# Patient Record
Sex: Male | Born: 1985 | Hispanic: Yes | Marital: Single | Smoking: Never smoker
Health system: Southern US, Community
[De-identification: ages and names within clinical notes are randomized; demographics above are authoritative.]

---

## 2018-09-26 ENCOUNTER — Ambulatory Visit: Payer: Self-pay | Admitting: Urology

## 2018-09-27 ENCOUNTER — Ambulatory Visit: Payer: Self-pay | Admitting: Urology

## 2019-02-10 ENCOUNTER — Other Ambulatory Visit: Payer: Self-pay

## 2019-02-10 ENCOUNTER — Encounter: Payer: Self-pay | Admitting: Emergency Medicine

## 2019-02-10 ENCOUNTER — Emergency Department
Admission: EM | Admit: 2019-02-10 | Discharge: 2019-02-10 | Disposition: A | Discharge status: Home / Self Care | Payer: Self-pay | Attending: Emergency Medicine | Admitting: Emergency Medicine

## 2019-02-10 DIAGNOSIS — K122 Cellulitis and abscess of mouth: Secondary | ICD-10-CM | POA: Insufficient documentation

## 2019-02-10 NOTE — ED Notes (Signed)
Pt stated he felt like he was going to throw up and I provided and emesis bag. Pt stated that was because "he drank a lot last night and was hung over"

## 2019-02-10 NOTE — ED Notes (Signed)
Pt states he was in shower this morning and tried to clear his throat and a "bubble came up in my throat and I couldn't talk or swallow" pt denies LOC. Pt states he can still feel the bubble in his throat. This RN looked in pt's throat with light and did not observe anything.

## 2019-02-10 NOTE — ED Provider Notes (Signed)
[facility] Emergency Department Provider Note  ____________________________________________   First MD Initiated Contact with Patient 02/10/19 1328     (approximate)  I have reviewed the triage vital signs and the nursing notes.   HISTORY  Chief Complaint Sore Throat   HPI Derrick Coleman is a 34 y.o. male presents to the ED with complaint of throat problems.  Patient states that he drank "a lot" of beer last night.  He states that this morning he was in the shower and try to clear his throat when a "bubble" came up in his throat.  He states that at that time he could not talk or swallow.  He also states that he was hung over.  He states that his girlfriend also "saw the bubble".  At that time patient was unable to speak in came straight to the emergency department.  While sitting in the lobby he was able to swallow his own secretions and also began talking.  He states that this is never happened to him before.  Prior to arrival he had not had any fluid to drink or food to eat.  Currently he denies any difficulty breathing or swallowing.  He denies any vomiting or reflux.      History reviewed. No pertinent past medical history.  There are no problems to display for this patient.   History reviewed. No pertinent surgical history.  Prior to Admission medications   Not on File    Allergies Patient has no known allergies.  History reviewed. No pertinent family history.  Social History Social History   Tobacco Use  . Smoking status: Never Smoker  . Smokeless tobacco: Never Used  Substance Use Topics  . Alcohol use: Yes  . Drug use: Never    Review of Systems Constitutional: No fever/chills Eyes: No visual changes. ENT: Positive questionable sore throat. Cardiovascular: Denies chest pain. Respiratory: Denies shortness of breath. Gastrointestinal:No nausea, no vomiting.  Genitourinary: Negative for dysuria. Musculoskeletal: Negative for  back pain. Skin: Negative for rash. Neurological: Negative for headaches, focal weakness or numbness. ____________________________________________   PHYSICAL EXAM:  VITAL SIGNS: ED Triage Vitals  Enc Vitals Group     BP 02/10/19 1220 122/84     Pulse Rate 02/10/19 1220 (!) 108     Resp 02/10/19 1220 18     Temp 02/10/19 1220 99.2 F (37.3 C)     Temp Source 02/10/19 1220 Oral     SpO2 02/10/19 1220 96 %     Weight 02/10/19 1220 150 lb (68 kg)     Height 02/10/19 1220 5\' 8"  (1.727 m)     Head Circumference --      Peak Flow --      Pain Score 02/10/19 1230 0     Pain Loc --      Pain Edu? --      Excl. in [place]? --    Constitutional: Alert and oriented. Well appearing and in no acute distress.  Patient is speaking without any difficulty and maintaining secretions as well. Eyes: Conjunctivae are normal.  Head: Atraumatic. Nose: No congestion/rhinnorhea. Mouth/Throat: Mucous membranes are moist.  Oropharynx non-erythematous.  Uvula is slightly edematous without erythema or exudate.  Patient is able to speak without any difficulty and maintain saliva without difficulty. Neck: No stridor.   Hematological/Lymphatic/Immunilogical: No cervical lymphadenopathy. Cardiovascular: Normal rate, regular rhythm. Grossly normal heart sounds.  Good peripheral circulation. Respiratory: Normal respiratory effort.  No retractions. Lungs CTAB. Musculoskeletal: Moves upper and  lower extremities with any difficulty.  Normal gait was noted. Neurologic:  Normal speech and language. No gross focal neurologic deficits are appreciated. No gait instability. Skin:  Skin is warm, dry and intact. Psychiatric: Mood and affect are normal. Speech and behavior are normal.  ____________________________________________   LABS (all labs ordered are listed, but only abnormal results are displayed)  Labs Reviewed - No data to display  PROCEDURES  Procedure(s) performed (including Critical Care):  Procedures    ____________________________________________   INITIAL IMPRESSION / ASSESSMENT AND PLAN / ED COURSE  As part of my medical decision making, I reviewed the following data within the electronic MEDICAL RECORD NUMBER Notes from prior ED visits and [place] Controlled Substance Database Derrick Coleman was evaluated in Emergency Department on 02/10/2019 for the symptoms described in the history of present illness. He was evaluated in the context of the global COVID-19 pandemic, which necessitated consideration that the patient might be at risk for infection with the SARS-CoV-2 virus that causes COVID-19. Institutional protocols and algorithms that pertain to the evaluation of patients at risk for COVID-19 are in a state of rapid change based on information released by regulatory bodies including the CDC and federal and state organizations. These policies and algorithms were followed during the patient's care in the ED.  34 year old male presents to the ED with complaint of difficulty clearing his throat this morning and also having a "bubble" in his throat.  Patient reports that he drank a lot of beer last evening and was hung over.  He states he was in the shower when this occurred.  Since his arrival in the ED waiting room he has began talking without any difficulty and states that he is having absolutely no difficulty swallowing his saliva.  On physical exam the uvula is slightly edematous without erythema and no exudate was noted.  Patient was able to speak in complete sentences and voice was not muffled.  Patient is advised to remain off alcohol until this is completely cleared up.  He is also to eat soft foods and avoid spicy or acidic beverages.  Patient is to return to the emergency department if any worsening of his symptoms or see Dr. Andee Poles who is on-call for  ENT.  ____________________________________________   FINAL CLINICAL IMPRESSION(S) / ED DIAGNOSES  Final diagnoses:  Uvulitis      ED Discharge Orders    None       Note:  This document was prepared using Dragon voice recognition software and may include unintentional dictation errors.    Tommi Rumps, PA-C 02/10/19 1558    Concha Se, MD 02/11/19 [phone]

## 2019-02-10 NOTE — Discharge Instructions (Signed)
Return to the emergency department if any severe worsening of your symptoms.  Follow-up with Dr. Andee Poles who is the ear nose and throat specialist if you continue to have problems.  Today drink fluids that are low in acid and avoid spicy salty foods.  Increase fluids and eat bland foods including ice cream, yogurt and foods with little spices.  Also avoid alcohol which may make this worse.

## 2019-02-10 NOTE — ED Triage Notes (Signed)
Pt reports he was drinking alcohol last night and woke up feeling ok.  He states "I always clear my throat when I take a shower and when I did today it got sensitive".  Pt reports this feeling comes and goes and when he opens his mouth it feels better. No food or drink today. Handling secretions.

## 2019-02-10 NOTE — ED Notes (Signed)
Derrick Coleman [phone]

## 2019-10-31 ENCOUNTER — Other Ambulatory Visit: Payer: Self-pay | Admitting: Family Medicine

## 2019-10-31 DIAGNOSIS — Q5522 Retractile testis: Secondary | ICD-10-CM

## 2019-11-08 ENCOUNTER — Ambulatory Visit
Admission: RE | Admit: 2019-11-08 | Discharge: 2019-11-08 | Disposition: A | Discharge status: Home / Self Care | Payer: Self-pay | Source: Ambulatory Visit | Attending: Family Medicine | Admitting: Family Medicine

## 2019-11-08 ENCOUNTER — Other Ambulatory Visit: Payer: Self-pay

## 2019-11-08 DIAGNOSIS — Q5522 Retractile testis: Secondary | ICD-10-CM | POA: Insufficient documentation

## 2022-06-02 IMAGING — US US SCROTUM W/ DOPPLER COMPLETE
1 series · 13 of 25 positions shown · non-contrast
Comparison: None

CLINICAL DATA: Retractile testis on RIGHT intermittently, RIGHT
inguinal pain

EXAM:
SCROTAL ULTRASOUND
DOPPLER ULTRASOUND OF THE TESTICLES
TECHNIQUE: Complete ultrasound examination of the testicles, epididymis, and
other scrotal structures was performed. Color and spectral Doppler
ultrasound were also utilized to evaluate blood flow to the
testicles.

[Series 1: us scrotum w/ doppler complete · 0.07mm/px · 13 of 96 slices shown]
[im 1/96]
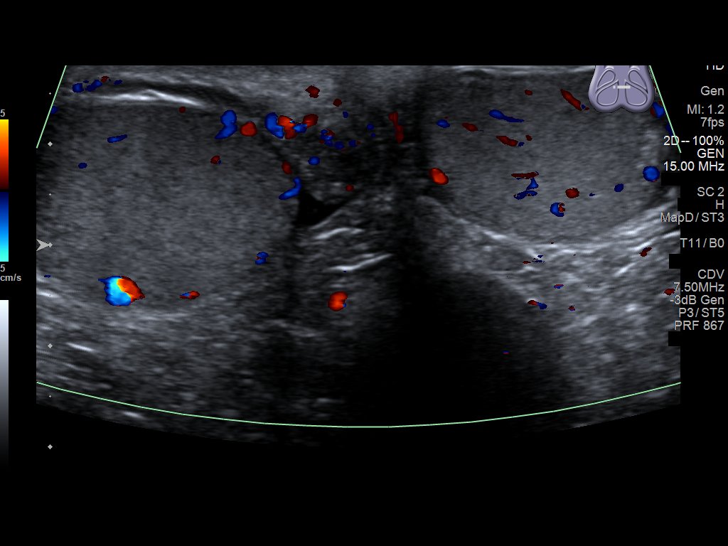
[im 8/96]
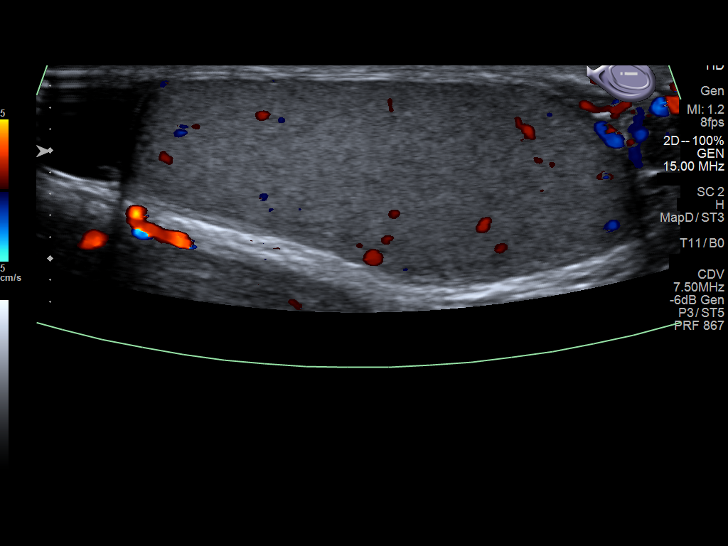
[im 16/96]
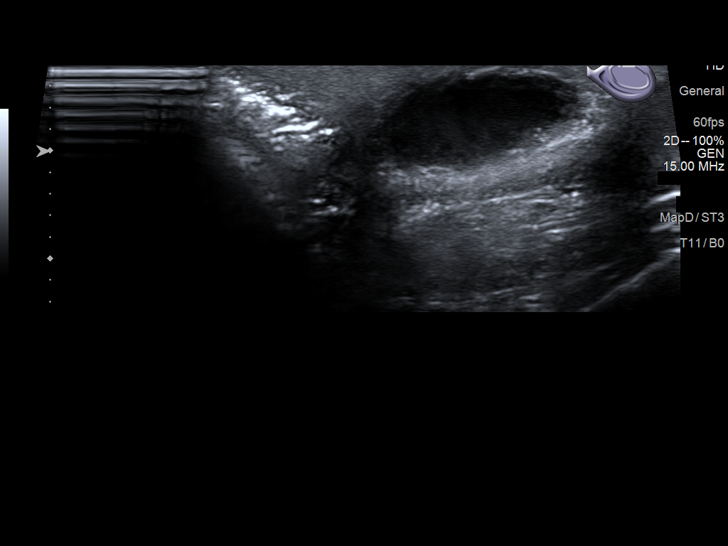
[im 24/96]
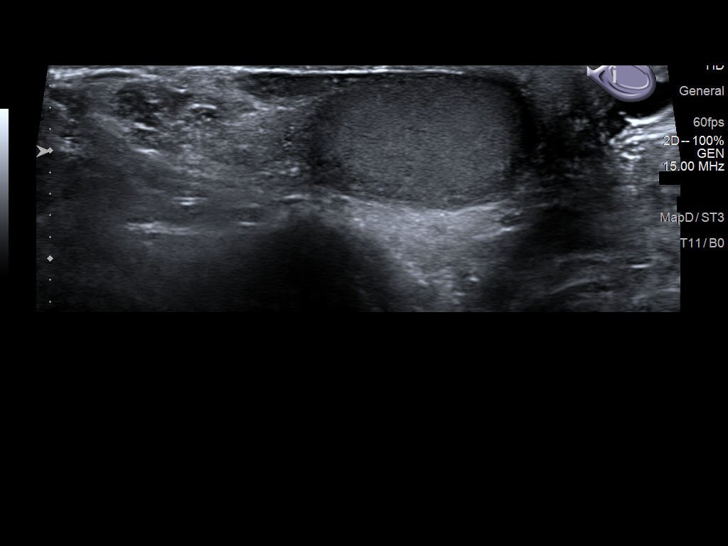
[im 32/96]
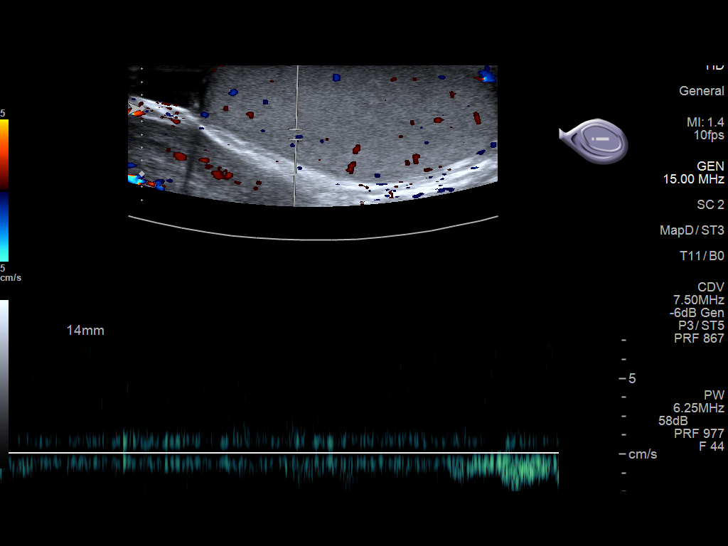
[im 40/96]
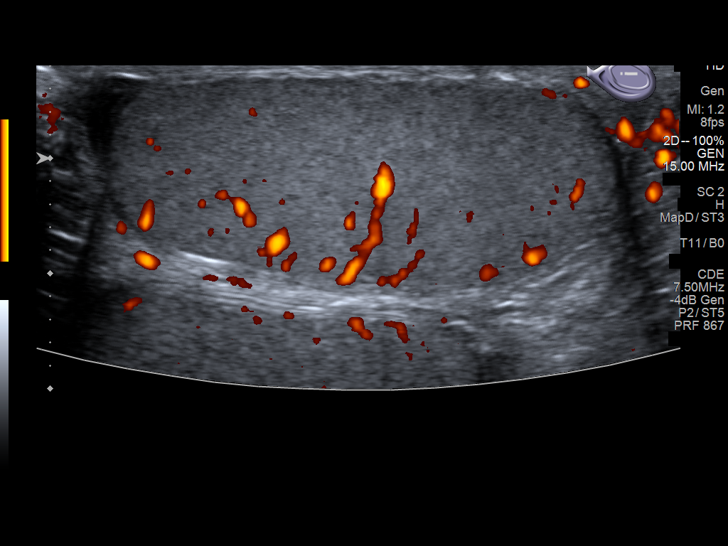
[im 48/96]
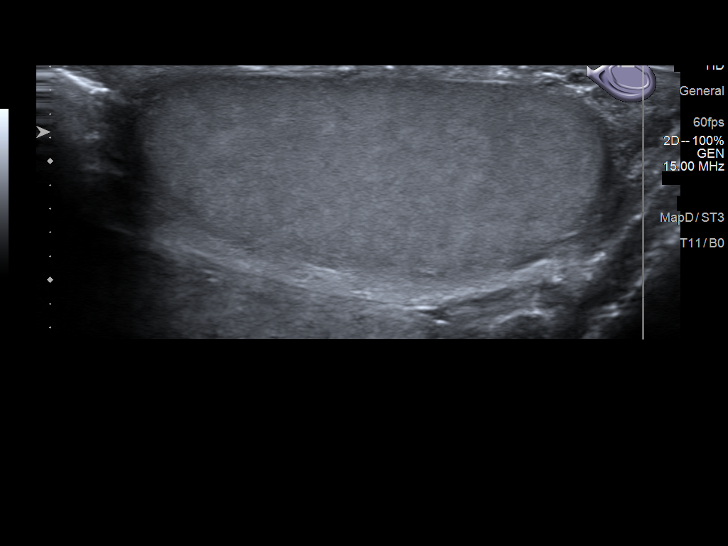
[im 56/96]
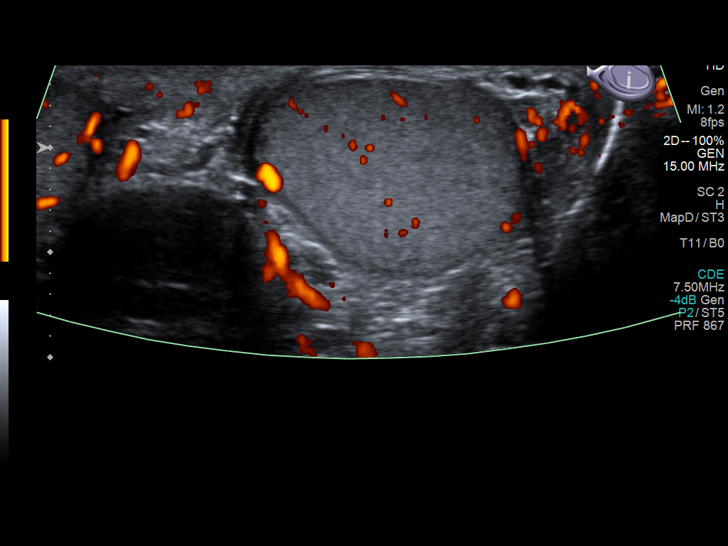
[im 64/96]
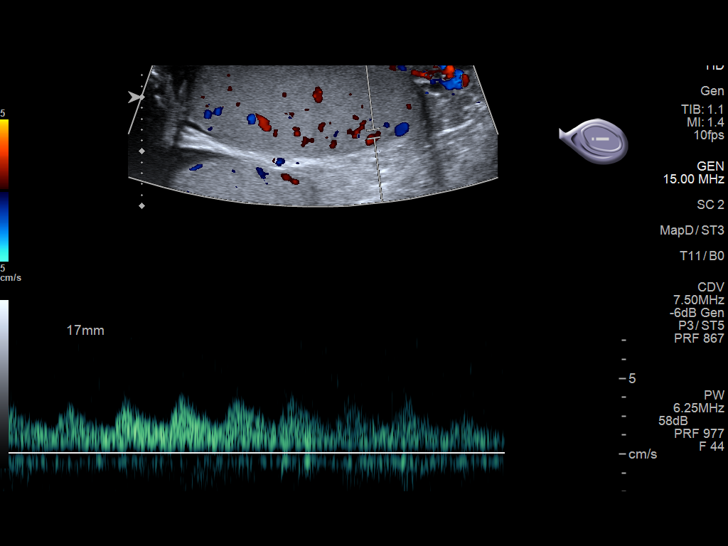
[im 72/96]
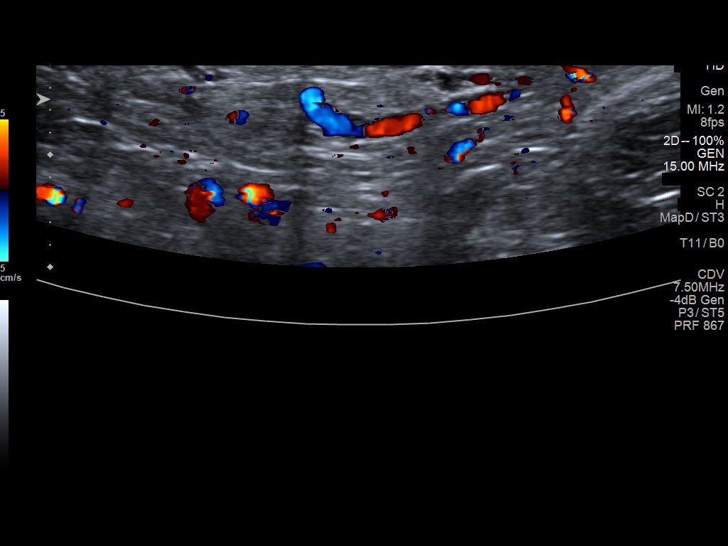
[im 80/96]
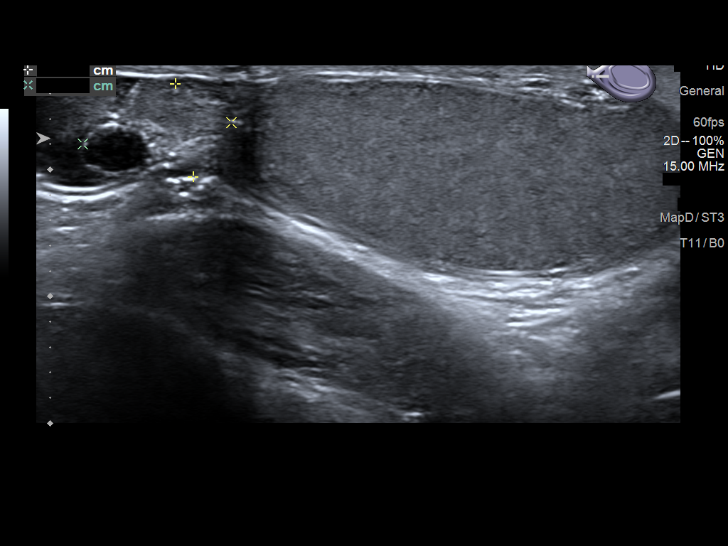
[im 88/96]
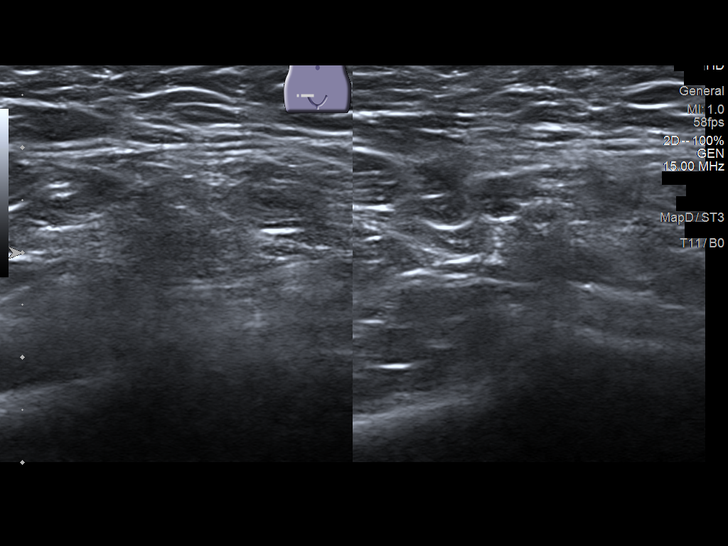
[im 96/96]
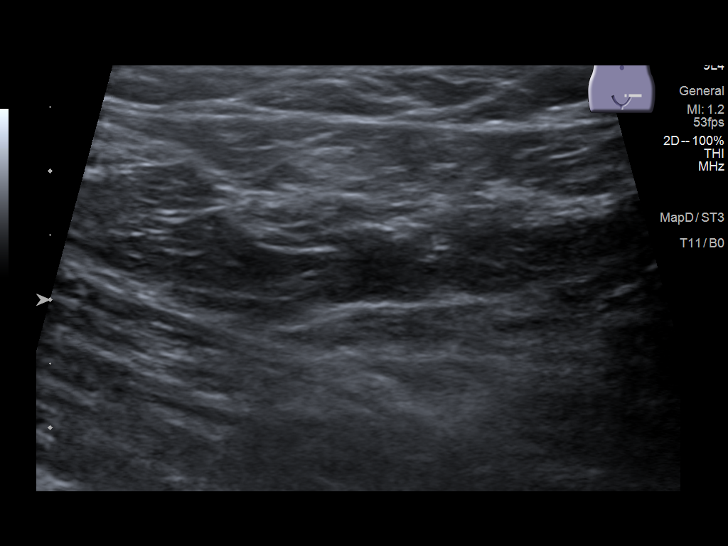

[13 of 25 positions shown; findings below may reference images not displayed]

FINDINGS: Right testicle

Measurements: 4.5 x 1.8 x 2.9 cm. Normal echogenicity without mass
or calcification. Internal blood flow present on color Doppler
imaging.

Left testicle

Measurements: 4.4 x 2.0 x 2.5 cm. Normal echogenicity without mass
or calcification. Internal blood flow present on color Doppler
imaging.

Right epididymis:  Normal in size and appearance.

Left epididymis: 6 x 6 x 4 mm cyst at LEFT epididymal head.
Remainder normal appearance.

Hydrocele:  None visualized.

Varicocele:  None visualized.

Pulsed Doppler interrogation of both testes demonstrates normal low
resistance arterial and venous waveforms bilaterally.

A fat containing RIGHT inguinal hernia is visualized, demonstrating
motion with Valsalva. This corresponds to the site of the patient's
pain. No bowel herniation seen.
IMPRESSION: 6 mm cyst at LEFT epididymal head.

Otherwise negative scrotal ultrasound.

Fat containing RIGHT inguinal hernia.
